# Patient Record
Sex: Female | Born: 1955 | Race: White | Hispanic: No | Marital: Married | Smoking: Never smoker
Health system: Southern US, Community
[De-identification: ages and names within clinical notes are randomized; demographics above are authoritative.]

---

## 2001-05-15 ENCOUNTER — Other Ambulatory Visit: Admission: RE | Admit: 2001-05-15 | Discharge: 2001-05-15 | Payer: Self-pay | Admitting: *Deleted

## 2002-07-18 ENCOUNTER — Other Ambulatory Visit: Admission: RE | Admit: 2002-07-18 | Discharge: 2002-07-18 | Payer: Self-pay | Admitting: Family Medicine

## 2003-07-23 ENCOUNTER — Other Ambulatory Visit: Admission: RE | Admit: 2003-07-23 | Discharge: 2003-07-23 | Payer: Self-pay | Admitting: Family Medicine

## 2004-11-15 ENCOUNTER — Other Ambulatory Visit: Admission: RE | Admit: 2004-11-15 | Discharge: 2004-11-15 | Payer: Self-pay | Admitting: *Deleted

## 2004-11-15 ENCOUNTER — Other Ambulatory Visit: Admission: RE | Admit: 2004-11-15 | Discharge: 2004-11-15 | Payer: Self-pay | Admitting: Family Medicine

## 2004-12-01 ENCOUNTER — Ambulatory Visit (HOSPITAL_COMMUNITY): Admission: RE | Admit: 2004-12-01 | Discharge: 2004-12-01 | Payer: Self-pay | Admitting: MOHS-Micrographic Surgery

## 2006-01-04 ENCOUNTER — Other Ambulatory Visit: Admission: RE | Admit: 2006-01-04 | Discharge: 2006-01-04 | Payer: Self-pay | Admitting: Family Medicine

## 2007-02-07 ENCOUNTER — Encounter: Admission: RE | Admit: 2007-02-07 | Discharge: 2007-02-07 | Payer: Self-pay | Admitting: Family Medicine

## 2007-02-14 ENCOUNTER — Encounter: Admission: RE | Admit: 2007-02-14 | Discharge: 2007-02-14 | Payer: Self-pay | Admitting: Family Medicine

## 2008-10-14 ENCOUNTER — Other Ambulatory Visit: Admission: RE | Admit: 2008-10-14 | Discharge: 2008-10-14 | Payer: Self-pay | Admitting: Family Medicine

## 2008-11-06 ENCOUNTER — Encounter: Admission: RE | Admit: 2008-11-06 | Discharge: 2008-11-06 | Payer: Self-pay | Admitting: Internal Medicine

## 2009-11-10 ENCOUNTER — Encounter (INDEPENDENT_AMBULATORY_CARE_PROVIDER_SITE_OTHER): Payer: Self-pay | Admitting: *Deleted

## 2009-11-17 ENCOUNTER — Encounter: Admission: RE | Admit: 2009-11-17 | Discharge: 2009-11-17 | Payer: Self-pay | Admitting: Internal Medicine

## 2009-12-25 ENCOUNTER — Encounter (INDEPENDENT_AMBULATORY_CARE_PROVIDER_SITE_OTHER): Payer: Self-pay

## 2009-12-29 ENCOUNTER — Ambulatory Visit: Payer: Self-pay | Admitting: Internal Medicine

## 2010-01-11 ENCOUNTER — Ambulatory Visit: Payer: Self-pay | Admitting: Internal Medicine

## 2010-01-13 ENCOUNTER — Encounter: Payer: Self-pay | Admitting: Internal Medicine

## 2010-03-30 NOTE — Procedures (Signed)
Summary: Colonoscopy  Patient: Dmya Long Note: All result statuses are Final unless otherwise noted.  Tests: (1) Colonoscopy (COL)   COL Colonoscopy           DONE     [facility]     [address]     [place], [place]  [address]           COLONOSCOPY PROCEDURE REPORT           PATIENT:  Destry, Bezdek  MR#:  [id number]     BIRTHDATE:  [birth year], 54 yrs. old  GENDER:  female     ENDOSCOPIST:  Wilhemina Bonito. Eda Keys, MD     REF. BY:  Geoffry Paradise, M.D.     PROCEDURE DATE:  01/11/2010     PROCEDURE:  Colonoscopy with snare polypectomy x 2     ASA CLASS:  Class I     INDICATIONS:  Routine Risk Screening     MEDICATIONS:   Fentanyl 100 mcg IV, Versed 10 mg IV           DESCRIPTION OF PROCEDURE:   After the risks benefits and     alternatives of the procedure were thoroughly explained, informed     consent was obtained.  Digital rectal exam was performed and     revealed no abnormalities.   The LB 180AL [id number] endoscope was     introduced through the anus and advanced to the cecum, which was     identified by both the appendix and ileocecal valve, without     limitations.Time to cecum = 3:50 min.  The quality of the prep was     excellent, using MoviPrep.  The instrument was then slowly     withdrawn (time = 13:50 min) as the colon was fully examined.     <<PROCEDUREIMAGES>>           FINDINGS:  Two 3mm sessile polyps were found in the cecum. Polyps     were snared without cautery. Retrieval was successful.   This was     otherwise a normal examination of the colon.    Unable to     retroflex due to narrow rectal vault.    The scope was then     withdrawn from the patient and the procedure completed.           COMPLICATIONS:  None           ENDOSCOPIC IMPRESSION:     1) Two snall polyps in the cecum - removed     2) Otherwise normal examination           RECOMMENDATIONS:     1) Repeat colonoscopy in 5 years if polyp adenomatous; otherwise     10 years         ______________________________     Wilhemina Bonito. Eda Keys, MD           CC:  Geoffry Paradise, MD; The Patient           n.     eSIGNED:   Wilhemina Bonito. Eda Keys at 01/11/2010 10:57 AM           Mcweeney, Jasmine December, [id number]  Note: An exclamation mark (!) indicates a result that was not dispersed into the flowsheet. Document Creation Date: 01/11/2010 10:58 AM _______________________________________________________________________  (1) Order result status: Final Collection or observation date-time: 01/11/2010 10:50 Requested date-time:  Receipt date-time:  Reported date-time:  Referring Physician:   Ordering Physician: Jonny Ruiz  Eda Keys [phone]) Specimen Source:  Source: Launa Grill Order Number: [id number] Lab site:   Appended Document: Colonoscopy     Procedures Next Due Date:    Colonoscopy: 12/2014

## 2010-03-30 NOTE — Letter (Signed)
Summary: Patient Notice- Polyp Results  [facility]  [address] [place], [place] [address]   Phone: [phone]  Fax: [phone]        January 13, 2010 MRN: [id number]    Paula Barker [address] [place], [place]  [address]    Dear Ms. Mancusi,  I am pleased to inform you that the colon polyp(s) removed during your recent colonoscopy was (were) found to be benign (no cancer detected) upon pathologic examination.  I recommend you have a repeat colonoscopy examination in 5 years to look for recurrent polyps, as having colon polyps increases your risk for having recurrent polyps or even colon cancer in the future.  Should you develop new or worsening symptoms of abdominal pain, bowel habit changes or bleeding from the rectum or bowels, please schedule an evaluation with either your primary care physician or with me.    Additional information/recommendations:  __ No further action with gastroenterology is needed at this time. Please      follow-up with your primary care physician for your other healthcare      needs.   Please call us if you are having persistent problems or have questions about your condition that have not been fully answered at this time.  Sincerely,  Hilarie Fredrickson MD  This letter has been electronically signed by your physician.  Appended Document: Patient Notice- Polyp Results Letter mailed

## 2010-03-30 NOTE — Miscellaneous (Signed)
Summary: Lec previsit  Clinical Lists Changes  Medications: Added new medication of MOVIPREP 100 GM  SOLR (PEG-KCL-NACL-NASULF-NA ASC-C) As per prep instructions. - Signed Rx of MOVIPREP 100 GM  SOLR (PEG-KCL-NACL-NASULF-NA ASC-C) As per prep instructions.;  #1 x 0;  Signed;  Entered by: Ulis Rias RN;  Authorized by: Hilarie Fredrickson MD;  Method used: Electron[facility] to [facility] [address] [phone]*, [address] [place], [place], [place]  [id number], Ph: [phone] or [phone], Fax: [phone] Observations: Added new observation of NKA: T (12/29/2009 7:52)    Prescriptions: MOVIPREP 100 GM  SOLR (PEG-KCL-NACL-NASULF-NA ASC-C) As per prep instructions.  #1 x 0   Entered by:   Ulis Rias RN   Authorized by:   Hilarie Fredrickson MD   Signed by:   Ulis Rias RN on 12/29/2009   Method used:   [facility]  [address] [phone] (retail)       [address]       [place], [place]  [id number]       Ph: [phone] or [phone]       Fax: [phone]   RxID:   [id number]

## 2010-03-30 NOTE — Letter (Signed)
Summary: Pre Visit Letter Revised  [facility]  [address] [place], [place] [address]   Phone: [phone]  Fax: [phone]        11/10/2009 MRN: [id number] Paula Barker [address] [place], [place]  [address]             Procedure Date:  01/11/2010   Welcome to the Gastroenterology Division at [facility].    You are scheduled to see a nurse for your pre-procedure visit on 12/29/2009 at 8:00AM on the 3rd floor at [facility], [address]  We ask that you try to arrive at our office 15 minutes prior to your appointment time to allow for check-in.  Please take a minute to review the attached form.  If you answer "Yes" to one or more of the questions on the first page, we ask that you call the person listed at your earliest opportunity.  If you answer "No" to all of the questions, please complete the rest of the form and bring it to your appointment.    Your nurse visit will consist of discussing your medical and surgical history, your immediate family medical history, and your medications.   If you are unable to list all of your medications on the form, please bring the medication bottles to your appointment and we will list them.  We will need to be aware of both prescribed and over the counter drugs.  We will need to know exact dosage information as well.    Please be prepared to read and sign documents such as consent forms, a financial agreement, and acknowledgement forms.  If necessary, and with your consent, a friend or relative is welcome to sit-in on the nurse visit with you.  Please bring your insurance card so that we may make a copy of it.  If your insurance requires a referral to see a specialist, please bring your referral form from your primary care physician.  No co-pay is required for this nurse visit.     If you cannot keep your appointment, please call [phone] to cancel or reschedule prior to your appointment date.   This allows [place] the opportunity to schedule an appointment for another patient in need of care.    Thank you for choosing [facility] for your medical needs.  We appreciate the opportunity to care for you.  Please visit [place] at our website  to learn more about our practice.  Sincerely, The Gastroenterology Division

## 2010-03-30 NOTE — Letter (Signed)
Summary: [facility] Instructions  [facility]  [address] [place], [place] [address]   Phone: [phone]  Fax: [phone]       Paula Barker    01-Jul-1966    MRN: [id number]        Procedure Day /Date: Monday 01/11/2010     Arrival Time: 9:00 am      Procedure Time: 10:00 am     Location of Procedure:                    _x _  [facility] (4th Floor)                        PREPARATION FOR COLONOSCOPY WITH MOVIPREP   Starting 5 days prior to your procedure Wednesday 11/9 do not eat nuts, seeds, popcorn, corn, beans, peas,  salads, or any raw vegetables.  Do not take any fiber supplements (e.g. Metamucil, Citrucel, and Benefiber).  THE DAY BEFORE YOUR PROCEDURE         DATE: Sunday 11/13 1.  Drink clear liquids the entire day-NO SOLID FOOD  2.  Do not drink anything colored red or purple.  Avoid juices with pulp.  No orange juice.  3.  Drink at least 64 oz. (8 glasses) of fluid/clear liquids during the day to prevent dehydration and help the prep work efficiently.  CLEAR LIQUIDS INCLUDE: Water Jello Ice Popsicles Tea (sugar ok, no milk/cream) Powdered fruit flavored drinks Coffee (sugar ok, no milk/cream) Gatorade Juice: apple, white grape, white cranberry  Lemonade Clear bullion, consomm, broth Carbonated beverages (any kind) Strained chicken noodle soup Hard Candy                             4.  In the morning, mix first dose of MoviPrep solution:    Empty 1 Pouch A and 1 Pouch B into the disposable container    Add lukewarm drinking water to the top line of the container. Mix to dissolve    Refrigerate (mixed solution should be used within 24 hrs)  5.  Begin drinking the prep at 5:00 p.m. The MoviPrep container is divided by 4 marks.   Every 15 minutes drink the solution down to the next mark (approximately 8 oz) until the full liter is complete.   6.  Follow completed prep with 16 oz of clear liquid of your choice (Nothing  red or purple).  Continue to drink clear liquids until bedtime.  7.  Before going to bed, mix second dose of MoviPrep solution:    Empty 1 Pouch A and 1 Pouch B into the disposable container    Add lukewarm drinking water to the top line of the container. Mix to dissolve    Refrigerate  THE DAY OF YOUR PROCEDURE      DATE: Monday 11/14  Beginning at 5:00 a.m. (5 hours before procedure):         1. Every 15 minutes, drink the solution down to the next mark (approx 8 oz) until the full liter is complete.  2. Follow completed prep with 16 oz. of clear liquid of your choice.    3. You may drink clear liquids until 8:00 am (2 HOURS BEFORE PROCEDURE).   MEDICATION INSTRUCTIONS  Unless otherwise instructed, you should take regular prescription medications with a small sip of water   as early as possible the morning of your procedure.  OTHER INSTRUCTIONS  You will need a responsible adult at least 53 years of age to accompany you and drive you home.   This person must remain in the waiting room during your procedure.  Wear loose fitting clothing that is easily removed.  Leave jewelry and other valuables at home.  However, you may wish to bring a book to read or  an iPod/MP3 player to listen to music as you wait for your procedure to start.  Remove all body piercing jewelry and leave at home.  Total time from sign-in until discharge is approximately 2-3 hours.  You should go home directly after your procedure and rest.  You can resume normal activities the  day after your procedure.  The day of your procedure you should not:   Drive   Make legal decisions   Operate machinery   Drink alcohol   Return to work  You will receive specific instructions about eating, activities and medications before you leave.    The above instructions have been reviewed and explained to me by   Ulis Rias RN  December 29, 2009 8:08 AM     I fully understand and can  verbalize these instructions _____________________________ Date _________

## 2011-05-03 ENCOUNTER — Other Ambulatory Visit: Payer: Self-pay | Admitting: Internal Medicine

## 2011-05-03 DIAGNOSIS — Z1231 Encounter for screening mammogram for malignant neoplasm of breast: Secondary | ICD-10-CM

## 2011-05-05 ENCOUNTER — Ambulatory Visit
Admission: RE | Admit: 2011-05-05 | Discharge: 2011-05-05 | Disposition: A | Discharge status: Home / Self Care | Payer: BC Managed Care – PPO | Source: Ambulatory Visit | Attending: Internal Medicine | Admitting: Internal Medicine

## 2011-05-05 DIAGNOSIS — Z1231 Encounter for screening mammogram for malignant neoplasm of breast: Secondary | ICD-10-CM

## 2011-10-03 ENCOUNTER — Encounter: Payer: Self-pay | Admitting: Internal Medicine

## 2011-10-03 NOTE — Progress Notes (Signed)
This encounter was created in error - please disregard.

## 2014-06-09 ENCOUNTER — Other Ambulatory Visit: Payer: Self-pay

## 2014-06-09 DIAGNOSIS — Z1231 Encounter for screening mammogram for malignant neoplasm of breast: Secondary | ICD-10-CM

## 2014-06-17 ENCOUNTER — Other Ambulatory Visit: Payer: Self-pay | Admitting: Internal Medicine

## 2014-06-17 ENCOUNTER — Ambulatory Visit
Admission: RE | Admit: 2014-06-17 | Discharge: 2014-06-17 | Disposition: A | Discharge status: Home / Self Care | Payer: BLUE CROSS/BLUE SHIELD | Source: Ambulatory Visit

## 2014-06-17 DIAGNOSIS — Z1231 Encounter for screening mammogram for malignant neoplasm of breast: Secondary | ICD-10-CM

## 2014-06-17 DIAGNOSIS — R928 Other abnormal and inconclusive findings on diagnostic imaging of breast: Secondary | ICD-10-CM

## 2014-06-23 ENCOUNTER — Ambulatory Visit
Admission: RE | Admit: 2014-06-23 | Discharge: 2014-06-23 | Disposition: A | Discharge status: Home / Self Care | Payer: BLUE CROSS/BLUE SHIELD | Source: Ambulatory Visit | Attending: Internal Medicine | Admitting: Internal Medicine

## 2014-06-23 DIAGNOSIS — R928 Other abnormal and inconclusive findings on diagnostic imaging of breast: Secondary | ICD-10-CM

## 2014-09-10 ENCOUNTER — Encounter: Payer: Self-pay | Admitting: Internal Medicine

## 2015-01-12 ENCOUNTER — Encounter: Payer: Self-pay | Admitting: Internal Medicine

## 2017-03-20 ENCOUNTER — Encounter (INDEPENDENT_AMBULATORY_CARE_PROVIDER_SITE_OTHER): Payer: Self-pay | Admitting: Orthopaedic Surgery

## 2017-03-20 ENCOUNTER — Ambulatory Visit (INDEPENDENT_AMBULATORY_CARE_PROVIDER_SITE_OTHER): Payer: PRIVATE HEALTH INSURANCE | Admitting: Orthopaedic Surgery

## 2017-03-20 DIAGNOSIS — G8929 Other chronic pain: Secondary | ICD-10-CM | POA: Diagnosis not present

## 2017-03-20 DIAGNOSIS — M25561 Pain in right knee: Secondary | ICD-10-CM | POA: Diagnosis not present

## 2017-03-20 NOTE — Progress Notes (Signed)
Office Visit Note   Patient: Paula AndersonSharon L Barker           Date of Birth: 03/24/1955           MRN: [id number] Visit Date: 03/20/2017              Requested by: No referring provider defined for this encounter. PCP: Patient, [name]   Assessment & Plan: Visit Diagnoses:  1. Chronic pain of right knee     Plan: Impression is a 62 year old female with mainly degenerative joint disease.  I reviewed her MRI results which mainly shows cystic changes with surrounding marrow edema of the patella and the patellofemoral compartment worse on the medial facet.  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  Her menisci are overall intact.  I recommend giving this more time.  Neoprene knee sleeve was provided today.  Home exercises were given.  Sample of Pennsaid was given today.  Patient declined oral NSAIDs or cortisone injection today.  Follow-Up Instructions: Return if symptoms worsen or fail to improve.   Orders:  No orders of the defined types were placed in this encounter.  No orders of the defined types were placed in this encounter.     Procedures: No procedures performed   Clinical Data: No additional findings.   Subjective: Chief Complaint  Patient presents with  . Right Knee - Pain    Patient is a 62 year old female comes in with right knee pain since November when she squatted deeply and felt a tearing sensation.  Since then she has had medial parapatellar pain and posterior lateral pain.  She denies any swelling.  She has had a cortisone injection with partial relief.  She continues to have pain and she comes in today for further evaluation and treatment.  She has already had an MRI and x-rays with Dr. Darrelyn HillockGioffre.      Review of Systems  Constitutional: Negative.   HENT: Negative.   Eyes: Negative.   Respiratory: Negative.   Cardiovascular: Negative.   Endocrine: Negative.   Musculoskeletal: Negative.   Neurological: Negative.     Hematological: Negative.   Psychiatric/Behavioral: Negative.   All other systems reviewed and are negative.    Objective: Vital Signs: There were no vitals taken for this visit.  Physical Exam  Constitutional: She is oriented to person, place, and time. She appears well-developed and well-nourished.  HENT:  Head: Normocephalic and atraumatic.  Eyes: EOM are normal.  Neck: Neck supple.  Pulmonary/Chest: Effort normal.  Abdominal: Soft.  Neurological: She is alert and oriented to person, place, and time.  Skin: Skin is warm. Capillary refill takes less than 2 seconds.  Psychiatric: She has a normal mood and affect. Her behavior is normal. Judgment and thought content normal.  Nursing note and vitals reviewed.   Ortho Exam Right knee exam shows no joint effusion.  Collaterals and cruciates are stable.  Normal range of motion of the knee. Specialty Comments:  No specialty comments available.  Imaging: No results found.   PMFS History: There are no active problems to display for this patient.  No past medical history on file.  No family history on file.   Social History   Occupational History  . Not on file  Tobacco Use  . Smoking status: Never Smoker  . Smokeless tobacco: Never Used  Substance and Sexual Activity  . Alcohol use: Not on file  . Drug use: Not on file  . Sexual activity:  Not on file

## 2017-04-04 ENCOUNTER — Encounter (INDEPENDENT_AMBULATORY_CARE_PROVIDER_SITE_OTHER): Payer: Self-pay | Admitting: Orthopaedic Surgery

## 2017-04-04 ENCOUNTER — Ambulatory Visit (INDEPENDENT_AMBULATORY_CARE_PROVIDER_SITE_OTHER): Payer: PRIVATE HEALTH INSURANCE | Admitting: Orthopaedic Surgery

## 2017-04-04 DIAGNOSIS — M25561 Pain in right knee: Secondary | ICD-10-CM | POA: Diagnosis not present

## 2017-04-04 DIAGNOSIS — M1711 Unilateral primary osteoarthritis, right knee: Secondary | ICD-10-CM | POA: Diagnosis not present

## 2017-04-04 DIAGNOSIS — G8929 Other chronic pain: Secondary | ICD-10-CM | POA: Diagnosis not present

## 2017-04-04 MED ORDER — LIDOCAINE HCL 1 % IJ SOLN
2.0000 mL | INTRAMUSCULAR | Status: AC | PRN
  Administered 2017-04-04: 2 mL

## 2017-04-04 MED ORDER — METHYLPREDNISOLONE ACETATE 40 MG/ML IJ SUSP
40.0000 mg | INTRAMUSCULAR | Status: AC | PRN
  Administered 2017-04-04: 40 mg via INTRA_ARTICULAR

## 2017-04-04 MED ORDER — DICLOFENAC SODIUM 1 % TD GEL: 2.0000 g | Freq: Four times a day (QID) | TRANSDERMAL | 5 refills | Status: AC

## 2017-04-04 MED ORDER — MELOXICAM 7.5 MG PO TABS: 7.5000 mg | ORAL_TABLET | Freq: Two times a day (BID) | ORAL | 2 refills | Status: AC | PRN

## 2017-04-04 MED ORDER — BUPIVACAINE HCL 0.5 % IJ SOLN
2.0000 mL | INTRAMUSCULAR | Status: AC | PRN
  Administered 2017-04-04: 2 mL via INTRA_ARTICULAR

## 2017-04-04 NOTE — Progress Notes (Signed)
Office Visit Note   Patient: Paula Barker           Date of Birth: Nov 29, 1955           MRN: [id number] Visit Date: 04/04/2017              Requested by: No referring provider defined for this encounter. PCP: Patient, [name]   Assessment & Plan: Visit Diagnoses:  1. Chronic pain of right knee   2. Unilateral primary osteoarthritis, right knee     Plan: Impression 62 year old female with arthritis and right knee pain.  I attempted to aspirate the knee but I was not able to obtain any fluid.  I did inject her knee again with cortisone today.  Continue with knee sleeve.  Out of work for rest of this week so that she can rest.  Follow-up in 3 weeks for recheck.  Prescription for Voltaren gel and Mobic.  Follow-Up Instructions: Return in about 3 weeks (around 04/25/2017).   Orders:  No orders of the defined types were placed in this encounter.  Meds ordered this encounter  Medications  . diclofenac sodium (VOLTAREN) 1 % GEL    Sig: Apply 2 g topically 4 (four) times daily.    Dispense:  1 Tube    Refill:  5  . meloxicam (MOBIC) 7.5 MG tablet    Sig: Take 1 tablet (7.5 mg total) by mouth 2 (two) times daily as needed for pain.    Dispense:  30 tablet    Refill:  2      Procedures: Large Joint Inj: R knee on 04/04/2017 9:04 AM Indications: pain Details: 22 G needle  Arthrogram: No  Medications: 40 mg methylPREDNISolone acetate 40 MG/ML; 2 mL lidocaine 1 %; 2 mL bupivacaine 0.5 % Consent was given by the patient. Patient was prepped and draped in the usual sterile fashion.       Clinical Data: No additional findings.   Subjective: Chief Complaint  Patient presents with  . Right Knee - Pain    Patient follows up for her right knee.  She feels that her pain is still about the same.  The neoprene sleeve does help give her stability.  The pain is worse in the swelling is worse at the end of the day.    Review of Systems  Constitutional: Negative.   HENT:  Negative.   Eyes: Negative.   Respiratory: Negative.   Cardiovascular: Negative.   Endocrine: Negative.   Musculoskeletal: Negative.   Neurological: Negative.   Hematological: Negative.   Psychiatric/Behavioral: Negative.   All other systems reviewed and are negative.    Objective: Vital Signs: There were no vitals taken for this visit.  Physical Exam  Constitutional: She is oriented to person, place, and time. She appears well-developed and well-nourished.  Pulmonary/Chest: Effort normal.  Neurological: She is alert and oriented to person, place, and time.  Skin: Skin is warm. Capillary refill takes less than 2 seconds.  Psychiatric: She has a normal mood and affect. Her behavior is normal. Judgment and thought content normal.  Nursing note and vitals reviewed.   Ortho Exam Right knee exam shows a trace effusion.  No warmth or evidence of infection. Specialty Comments:  No specialty comments available.  Imaging: No results found.   PMFS History: There are no active problems to display for this patient.  History reviewed. No pertinent past medical history.  History reviewed. No pertinent family history.  History reviewed. No pertinent surgical  history. Social History   Occupational History  . Not on file  Tobacco Use  . Smoking status: Never Smoker  . Smokeless tobacco: Never Used  Substance and Sexual Activity  . Alcohol use: Not on file  . Drug use: Not on file  . Sexual activity: Not on file

## 2017-04-25 ENCOUNTER — Encounter (INDEPENDENT_AMBULATORY_CARE_PROVIDER_SITE_OTHER): Payer: Self-pay | Admitting: Orthopaedic Surgery

## 2017-04-25 ENCOUNTER — Ambulatory Visit (INDEPENDENT_AMBULATORY_CARE_PROVIDER_SITE_OTHER): Payer: PRIVATE HEALTH INSURANCE | Admitting: Orthopaedic Surgery

## 2017-04-25 ENCOUNTER — Telehealth (INDEPENDENT_AMBULATORY_CARE_PROVIDER_SITE_OTHER): Payer: Self-pay

## 2017-04-25 DIAGNOSIS — M1711 Unilateral primary osteoarthritis, right knee: Secondary | ICD-10-CM | POA: Diagnosis not present

## 2017-04-25 DIAGNOSIS — G8929 Other chronic pain: Secondary | ICD-10-CM

## 2017-04-25 DIAGNOSIS — M25561 Pain in right knee: Secondary | ICD-10-CM | POA: Diagnosis not present

## 2017-04-25 NOTE — Progress Notes (Signed)
   Office Visit Note   Patient: Paula Barker           Date of Birth: 07-14-1955           MRN: [id number] Visit Date: 04/25/2017              Requested by: No referring provider defined for this encounter. PCP: Patient, [name]   Assessment & Plan: Visit Diagnoses:  1. Unilateral primary osteoarthritis, right knee   2. Chronic pain of right knee     Plan: Today we discussed possibility of trying Visco supplementation injections.  Pamphlet was given today.  Questions encouraged and answered.  She will let us know if she wants us to proceed.  We will submit the request to see what her out-of-pocket cost will be.  We will be in touch with her.  Follow-Up Instructions: Return if symptoms worsen or fail to improve.   Orders:  No orders of the defined types were placed in this encounter.  No orders of the defined types were placed in this encounter.     Procedures: No procedures performed   Clinical Data: No additional findings.   Subjective: Chief Complaint  Patient presents with  . Right Knee - Pain, Follow-up    Paula Barker follows up today for her knee pain.  She is doing about the same.  Not significantly better or worse.    Review of Systems   Objective: Vital Signs: There were no vitals taken for this visit.  Physical Exam  Ortho Exam Right knee exam shows no joint effusion.  Her pain is still around the medial aspect of the patella Specialty Comments:  No specialty comments available.  Imaging: No results found.   PMFS History: There are no active problems to display for this patient.  History reviewed. No pertinent past medical history.  History reviewed. No pertinent family history.  History reviewed. No pertinent surgical history. Social History   Occupational History  . Not on file  Tobacco Use  . Smoking status: Never Smoker  . Smokeless tobacco: Never Used  Substance and Sexual Activity  . Alcohol use: Not on file  . Drug use: Not  on file  . Sexual activity: Not on file

## 2017-04-25 NOTE — Telephone Encounter (Signed)
Please submit application for  MONOVISC INJ RIGHT KNEE XU

## 2017-04-25 NOTE — Telephone Encounter (Signed)
Faxed Monovisc application for right knee to patient enrollment at [phone].

## 2017-05-10 ENCOUNTER — Telehealth (INDEPENDENT_AMBULATORY_CARE_PROVIDER_SITE_OTHER): Payer: Self-pay

## 2017-05-10 NOTE — Telephone Encounter (Signed)
Talked with patient and advised her that she was approved through her insurance for right knee, Monovisc injection.  Patient stated that she will decide if she still wants to have injection done and give us a call back when ready.

## 2019-01-10 ENCOUNTER — Other Ambulatory Visit: Payer: Self-pay | Admitting: Obstetrics and Gynecology

## 2019-01-10 DIAGNOSIS — N6310 Unspecified lump in the right breast, unspecified quadrant: Secondary | ICD-10-CM

## 2019-01-11 ENCOUNTER — Ambulatory Visit
Admission: RE | Admit: 2019-01-11 | Discharge: 2019-01-11 | Disposition: A | Discharge status: Home / Self Care | Payer: BLUE CROSS/BLUE SHIELD | Source: Ambulatory Visit | Attending: Obstetrics and Gynecology | Admitting: Obstetrics and Gynecology

## 2019-01-11 ENCOUNTER — Other Ambulatory Visit: Payer: Self-pay

## 2019-01-11 DIAGNOSIS — N6310 Unspecified lump in the right breast, unspecified quadrant: Secondary | ICD-10-CM

## 2021-02-05 IMAGING — MG MM DIGITAL DIAGNOSTIC UNILAT*R* W/ TOMO W/ CAD
4 series · 4 of 12 positions shown · non-contrast
Comparison: 01/01/2019 and earlier

CLINICAL DATA: Patient returns after screening study for evaluation
of a possible RIGHT breast mass.

EXAM:
DIGITAL DIAGNOSTIC RIGHT MAMMOGRAM WITH CAD AND TOMO
ULTRASOUND RIGHT BREAST

[R MLO synth-2D]
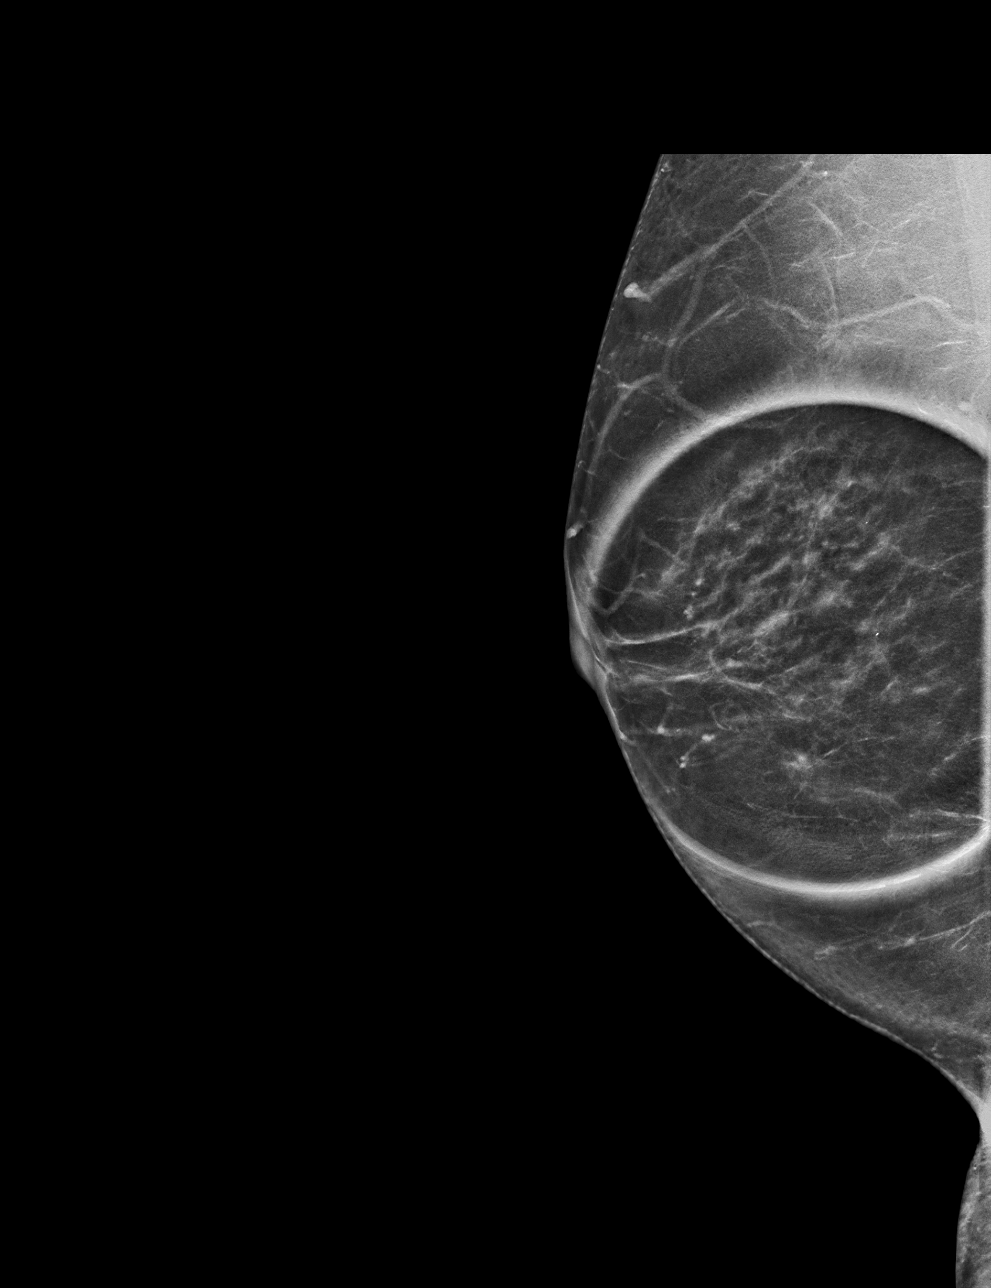

[R CC synth-2D]
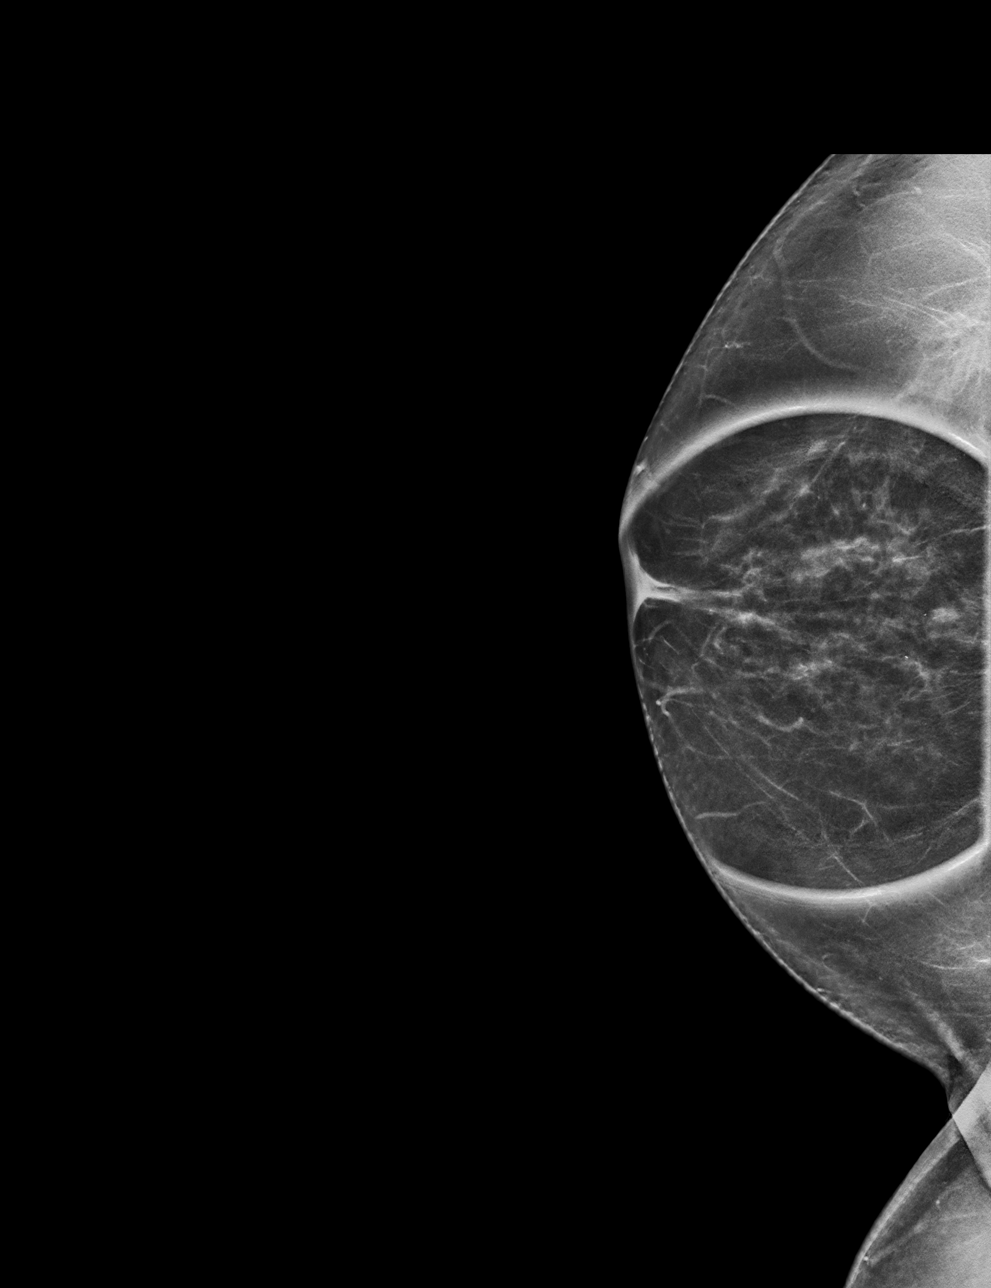

[R MLO tomo · tomo slice 33/66.0]
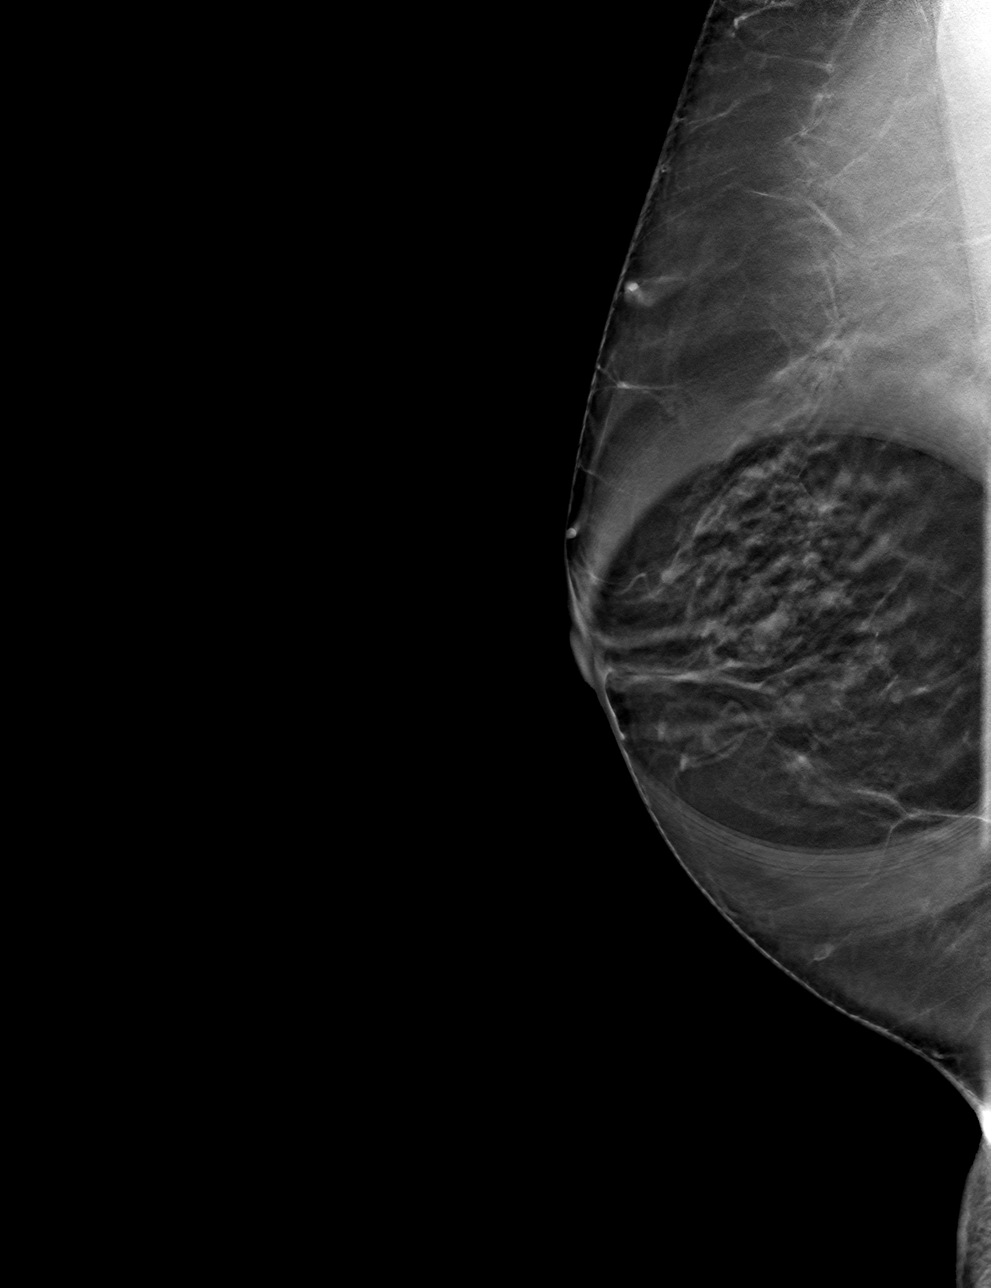

[R CC tomo · tomo slice 32/63.0]
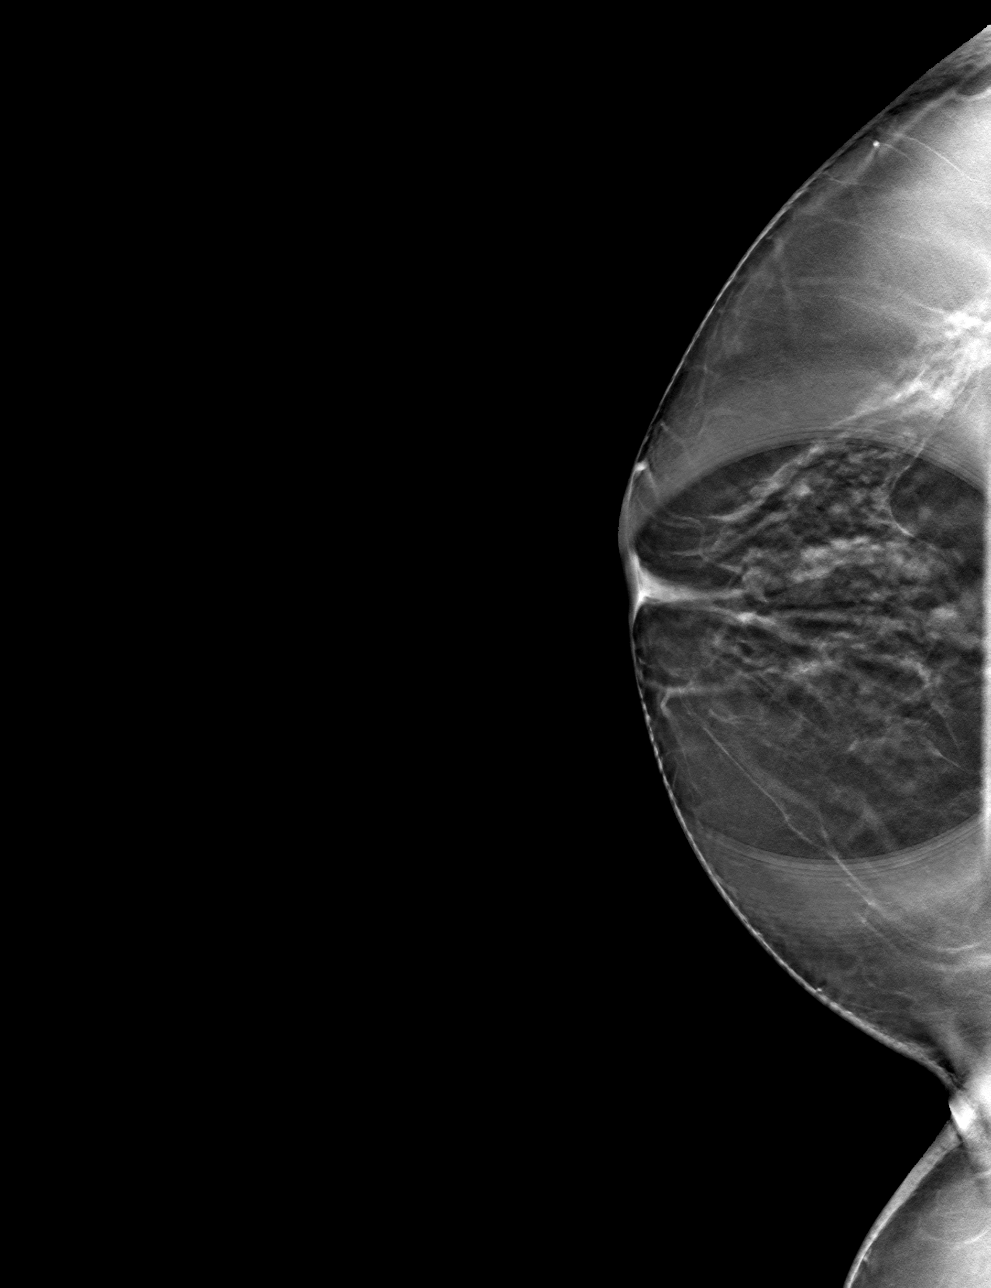

[4 of 12 positions shown; findings below may reference images not displayed]

ACR Breast Density Category b: There are scattered areas of
fibroglandular density.
FINDINGS: Additional 2-D and 3-D images are performed. These views show
fibroglandular tissue without discrete mass in the immediate
retroareolar region of the RIGHT breast.

Mammographic images were processed with CAD.

On physical exam, I palpate no abnormality in the retroareolar
region of the RIGHT breast.

Targeted ultrasound is performed, showing normal appearing
fibroglandular tissue in the retroareolar region of the RIGHT
breast. No suspicious mass, distortion, or acoustic shadowing is
demonstrated with ultrasound.
IMPRESSION: No mammographic or ultrasound evidence for malignancy.

RECOMMENDATION:
Screening mammogram in one year.(Code:QX-X-O93)

I have discussed the findings and recommendations with the patient.
If applicable, a reminder letter will be sent to the patient
regarding the next appointment.

BI-RADS CATEGORY  1: Negative.
